# Patient Record
Sex: Male | Born: 1985 | Race: White | Hispanic: No | Marital: Married | Smoking: Never smoker
Health system: Southern US, Community
[De-identification: ages and names within clinical notes are randomized; demographics above are authoritative.]

## PROBLEM LIST (undated history)

## (undated) DIAGNOSIS — K602 Anal fissure, unspecified: Secondary | ICD-10-CM

## (undated) HISTORY — DX: Anal fissure, unspecified: K60.2

## (undated) HISTORY — PX: COLONOSCOPY: SHX174

---

## 2010-05-12 ENCOUNTER — Emergency Department (HOSPITAL_COMMUNITY): Admission: EM | Admit: 2010-05-12 | Discharge: 2010-05-12 | Payer: Self-pay | Admitting: Family Medicine

## 2012-05-13 ENCOUNTER — Ambulatory Visit: Payer: Self-pay | Admitting: Physician Assistant

## 2013-05-03 ENCOUNTER — Encounter: Payer: Self-pay | Admitting: Gastroenterology

## 2013-05-24 ENCOUNTER — Ambulatory Visit (INDEPENDENT_AMBULATORY_CARE_PROVIDER_SITE_OTHER): Payer: No Typology Code available for payment source | Admitting: Gastroenterology

## 2013-05-24 ENCOUNTER — Encounter: Payer: Self-pay | Admitting: Gastroenterology

## 2013-05-24 VITALS — BP 106/68 | HR 76 | Ht 69.75 in | Wt 177.4 lb

## 2013-05-24 DIAGNOSIS — K602 Anal fissure, unspecified: Secondary | ICD-10-CM

## 2013-05-24 MED ORDER — HYDROCORTISONE ACETATE 25 MG RE SUPP: 25.0000 mg | Freq: Two times a day (BID) | RECTAL | Status: DC

## 2013-05-24 MED ORDER — AMBULATORY NON FORMULARY MEDICATION: Status: DC

## 2013-05-24 NOTE — Assessment & Plan Note (Signed)
Plan stool softeners, Anusol a.c. suppository and diltiazem ointment for at least 4 weeks

## 2013-05-24 NOTE — Patient Instructions (Addendum)
Follow up in one month We are sending in your prescriptions to [facility]

## 2013-05-24 NOTE — Progress Notes (Signed)
History of Present Illness: 27 year old white male referred for evaluation of rectal discomfort.  For several months he's had progressive pain when he has a bowel movement.  There's been some bleeding in the toilet water and on the tissue.  He tends to be constipated.  He's tried topicals and stool softeners without relief.  He denies abdominal pain or melena.    History reviewed. No pertinent past medical history. History reviewed. No pertinent past surgical history. family history includes Diabetes in his maternal grandmother; Heart disease in his other. Current Outpatient Prescriptions  Medication Sig Dispense Refill  . docusate sodium (COLACE) 100 MG capsule Take 100 mg by mouth as needed for constipation.      . polyethylene glycol powder (GLYCOLAX/MIRALAX) powder Take 17 g by mouth as needed.       No current facility-administered medications for this visit.   Allergies as of 05/24/2013  . (No Known Allergies)    reports that he has never smoked. He has never used smokeless tobacco. He reports that he does not drink alcohol or use illicit drugs.     Review of Systems: Pertinent positive and negative review of systems were noted in the above HPI section. All other review of systems were otherwise negative.  Vital signs were reviewed in today's medical record Physical Exam: General: Well developed , well nourished, no acute distress Skin: anicteric Head: Normocephalic and atraumatic Eyes:  sclerae anicteric, EOMI Ears: Normal auditory acuity Mouth: No deformity or lesions Neck: Supple, no masses or thyromegaly Lungs: Clear throughout to auscultation Heart: Regular rate and rhythm; no murmurs, rubs or bruits Abdomen: Soft, non tender and non distended. No masses, hepatosplenomegaly or hernias noted. Normal Bowel sounds Rectal: Inspection of the rectum demonstrates an anal fissure at the posterior midline Musculoskeletal: Symmetrical with no gross deformities  Skin: No lesions  on visible extremities Pulses:  Normal pulses noted Extremities: No clubbing, cyanosis, edema or deformities noted Neurological: Alert oriented x 4, grossly nonfocal Cervical Nodes:  No significant cervical adenopathy Inguinal Nodes: No significant inguinal adenopathy Psychological:  Alert and cooperative. Normal mood and affect

## 2013-06-23 ENCOUNTER — Ambulatory Visit (INDEPENDENT_AMBULATORY_CARE_PROVIDER_SITE_OTHER): Payer: No Typology Code available for payment source | Admitting: Gastroenterology

## 2013-06-23 ENCOUNTER — Encounter: Payer: Self-pay | Admitting: Gastroenterology

## 2013-06-23 VITALS — BP 90/60 | HR 60 | Ht 70.0 in | Wt 179.0 lb

## 2013-06-23 DIAGNOSIS — K602 Anal fissure, unspecified: Secondary | ICD-10-CM

## 2013-06-23 NOTE — Progress Notes (Signed)
History of Present Illness:  The patient has returned for followup of his anal fissure.  He continues on diltiazem.  Symptoms are significantly improved.  Pain is decreasing.  He has a tendency to be constipated and takes Colace.    Review of Systems: Pertinent positive and negative review of systems were noted in the above HPI section. All other review of systems were otherwise negative.    Current Medications, Allergies, Past Medical History, Past Surgical History, Family History and Social History were reviewed in [employer] electronic medical record  Vital signs were reviewed in today's medical record. Physical Exam: General: Well developed , well nourished, no acute distress

## 2013-06-23 NOTE — Assessment & Plan Note (Signed)
Symptoms are improving with diltiazem ointment.  Plan to continue for one to 2 months

## 2013-06-23 NOTE — Patient Instructions (Signed)
Follow up as needed

## 2013-08-19 IMAGING — US ABDOMEN ULTRASOUND LIMITED
1 series · 14 of 25 positions shown · non-contrast
Comparison: none

REASON FOR EXAM: RUQ abd pain  eval gallbladder
COMMENTS:

PROCEDURE:     US  - US ABDOMEN LIMITED SURVEY  - May 13, 2012  [DATE]
RESULT:     Gallbladder normal. Gallbladder wall thickness normal at 1.7 mm.
Common bile duct caliber normal at 2.9 mm. Negative Murphy's sign. Pancreas
not visualized.

[Series 1: abdomen ultrasound limited · 0.25mm/px · 14 of 43 slices shown]
[im 1/43]
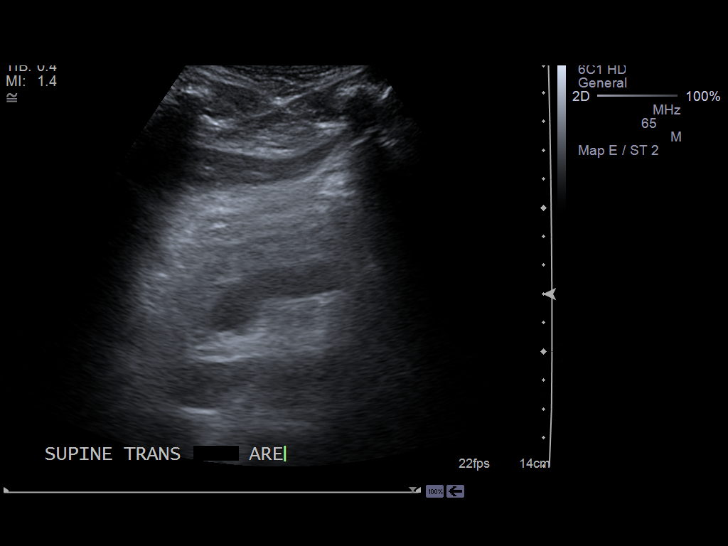
[im 4/43]
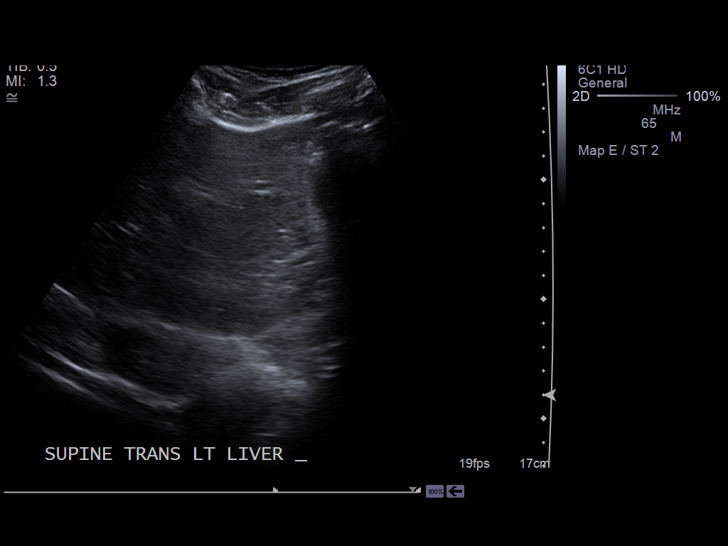
[im 8/43]
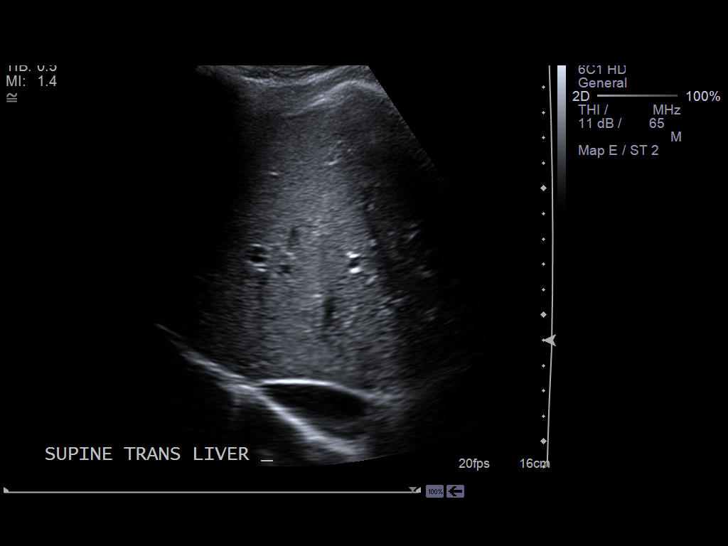
[im 11/43]
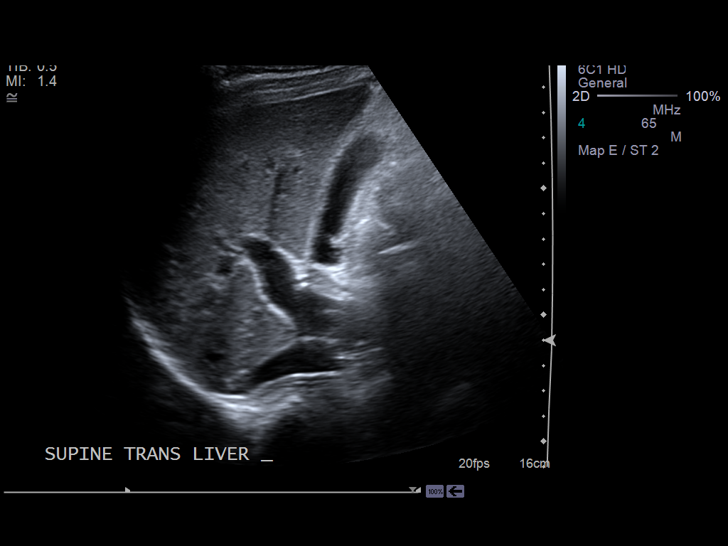
[im 15/43]
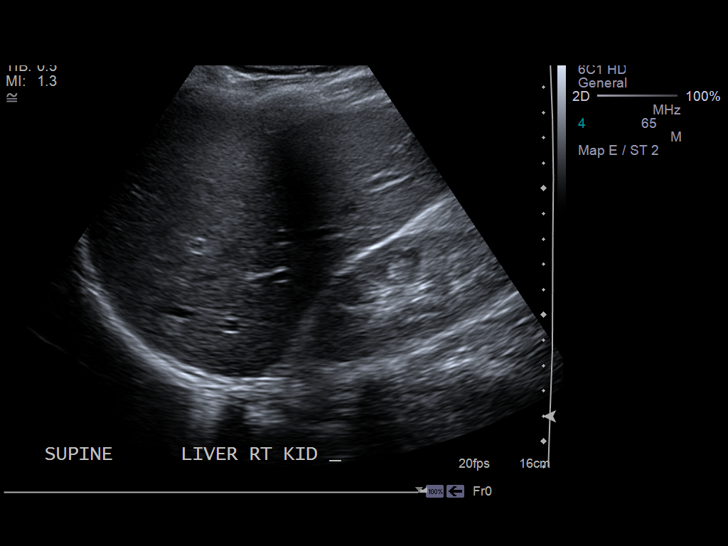
[im 16/43]
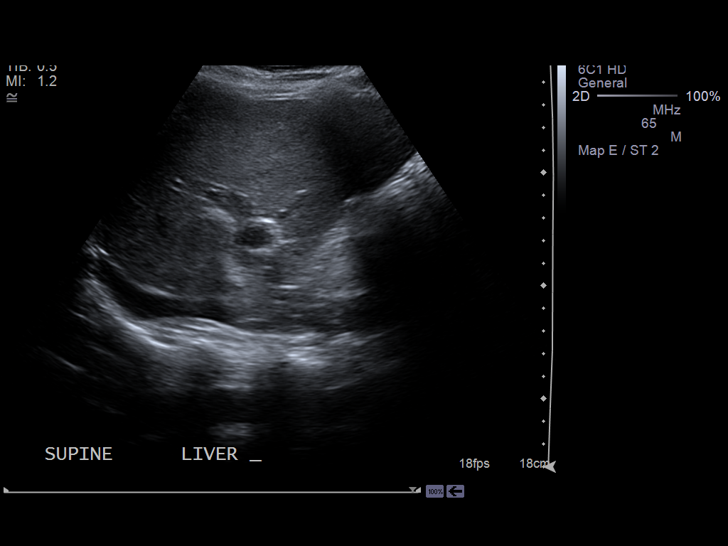
[im 20/43]
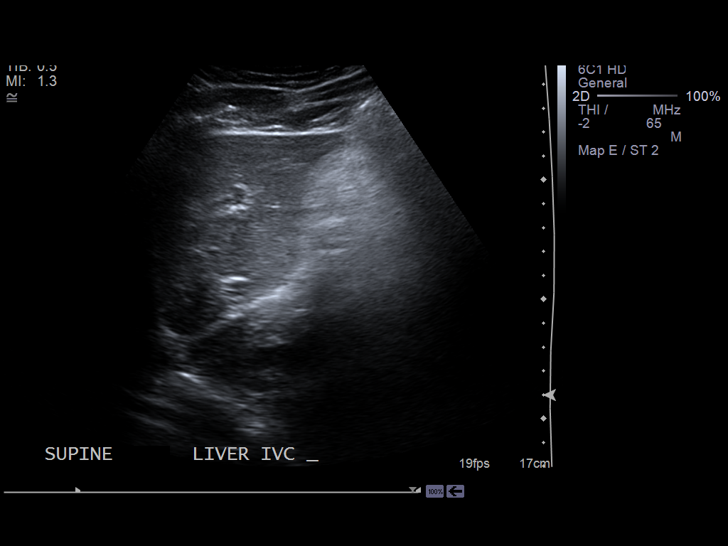
[im 23/43]
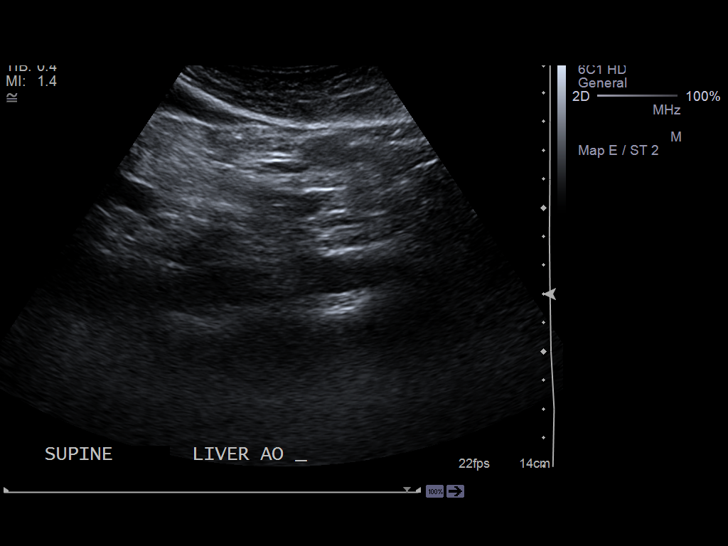
[im 27/43]
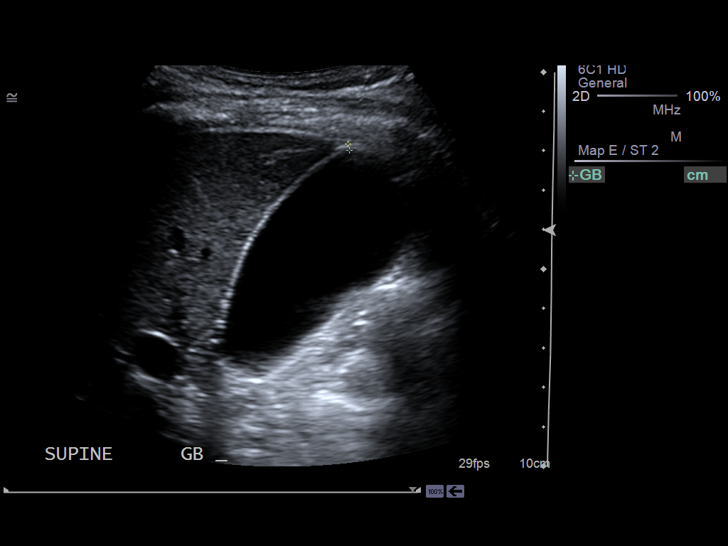
[im 29/43]
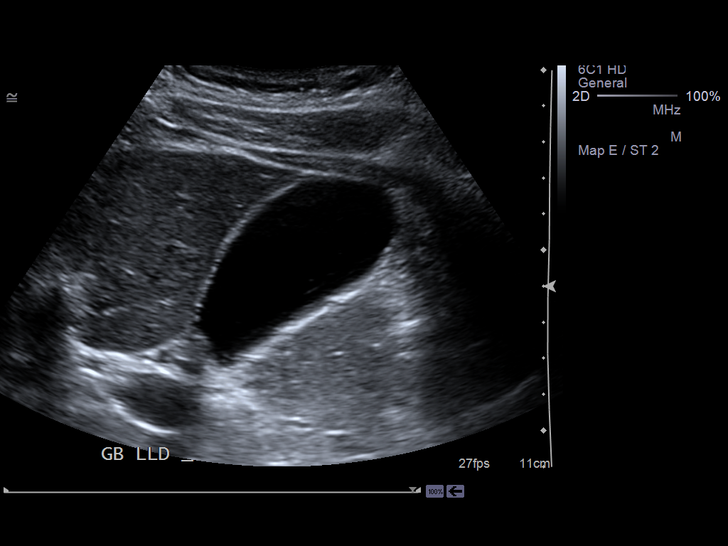
[im 32/43]
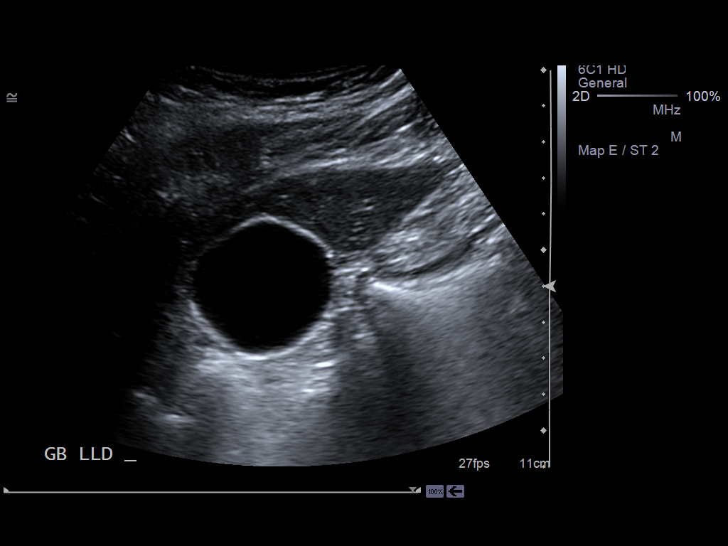
[im 36/43]
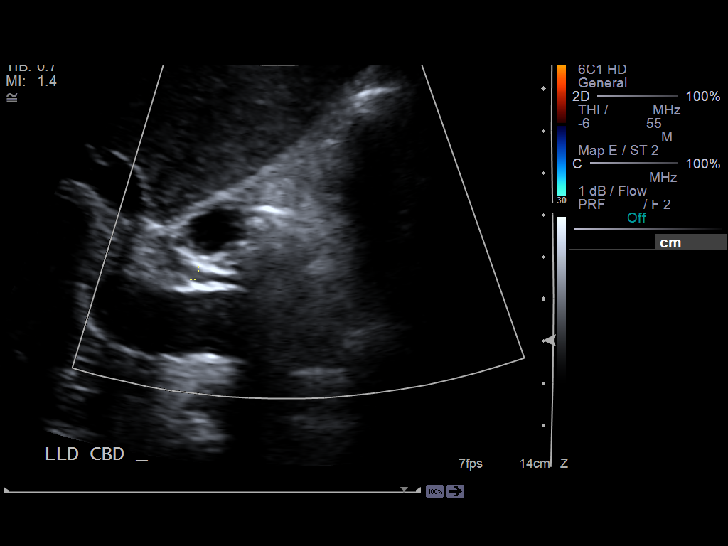
[im 39/43]
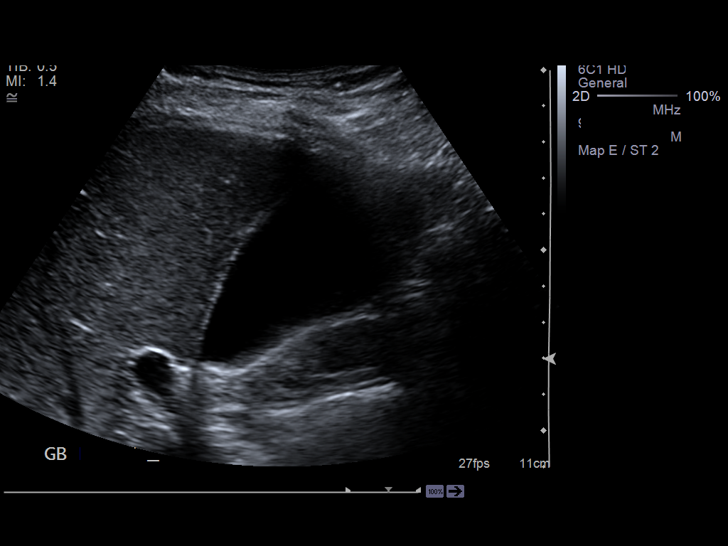
[im 43/43]
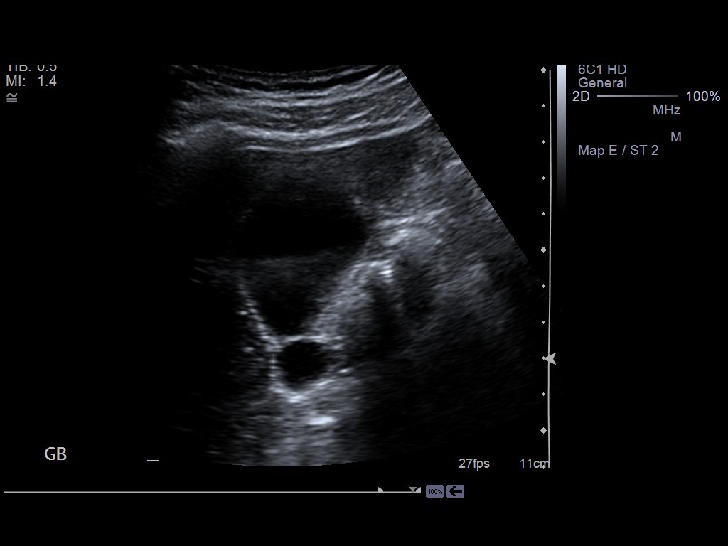

[14 of 25 positions shown; findings below may reference images not displayed]

IMPRESSION: Normal exam.

[REDACTED]

## 2016-12-10 ENCOUNTER — Ambulatory Visit: Payer: No Typology Code available for payment source | Admitting: Physician Assistant

## 2018-06-03 DIAGNOSIS — F40248 Other situational type phobia: Secondary | ICD-10-CM | POA: Insufficient documentation

## 2018-06-03 DIAGNOSIS — F4010 Social phobia, unspecified: Secondary | ICD-10-CM | POA: Insufficient documentation

## 2018-08-20 ENCOUNTER — Other Ambulatory Visit (HOSPITAL_COMMUNITY): Payer: Self-pay | Admitting: Family Medicine

## 2018-08-20 ENCOUNTER — Other Ambulatory Visit: Payer: Self-pay | Admitting: Family Medicine

## 2018-08-20 DIAGNOSIS — R51 Headache: Principal | ICD-10-CM

## 2018-08-20 DIAGNOSIS — R519 Headache, unspecified: Secondary | ICD-10-CM

## 2018-09-09 ENCOUNTER — Ambulatory Visit
Admission: RE | Admit: 2018-09-09 | Discharge: 2018-09-09 | Disposition: A | Discharge status: Home / Self Care | Payer: No Typology Code available for payment source | Source: Ambulatory Visit | Attending: Family Medicine | Admitting: Family Medicine

## 2018-09-09 DIAGNOSIS — R519 Headache, unspecified: Secondary | ICD-10-CM

## 2018-09-09 DIAGNOSIS — R51 Headache: Secondary | ICD-10-CM | POA: Diagnosis present

## 2018-09-09 MED ORDER — GADOBUTROL 1 MMOL/ML IV SOLN
7.5000 mL | Freq: Once | INTRAVENOUS | Status: AC | PRN
  Administered 2018-09-09: 7.5 mL via INTRAVENOUS

## 2019-11-07 DIAGNOSIS — H15831 Staphyloma posticum, right eye: Secondary | ICD-10-CM | POA: Diagnosis not present

## 2020-06-11 DIAGNOSIS — Z0000 Encounter for general adult medical examination without abnormal findings: Secondary | ICD-10-CM | POA: Diagnosis not present

## 2020-06-11 DIAGNOSIS — Z131 Encounter for screening for diabetes mellitus: Secondary | ICD-10-CM | POA: Diagnosis not present

## 2020-06-11 DIAGNOSIS — Z13220 Encounter for screening for lipoid disorders: Secondary | ICD-10-CM | POA: Diagnosis not present

## 2020-11-08 ENCOUNTER — Other Ambulatory Visit (HOSPITAL_COMMUNITY): Payer: Self-pay | Admitting: Ophthalmology

## 2021-04-16 ENCOUNTER — Other Ambulatory Visit: Payer: Self-pay

## 2021-04-19 ENCOUNTER — Other Ambulatory Visit: Payer: Self-pay

## 2021-04-19 MED ORDER — EPINEPHRINE 0.3 MG/0.3ML IJ SOAJ
INTRAMUSCULAR | 0 refills | Status: DC
  Filled 2021-04-19: qty 2, 15d supply, fill #0

## 2021-04-25 ENCOUNTER — Other Ambulatory Visit: Payer: Self-pay

## 2022-05-02 ENCOUNTER — Other Ambulatory Visit: Payer: Self-pay

## 2022-05-02 DIAGNOSIS — E663 Overweight: Secondary | ICD-10-CM | POA: Insufficient documentation

## 2022-05-02 MED ORDER — WEGOVY 0.25 MG/0.5ML ~~LOC~~ SOAJ
SUBCUTANEOUS | 3 refills | Status: DC
  Filled 2022-05-02 – 2022-09-09 (×5): qty 2, 28d supply, fill #0

## 2022-05-07 ENCOUNTER — Other Ambulatory Visit: Payer: Self-pay

## 2022-05-15 ENCOUNTER — Other Ambulatory Visit: Payer: Self-pay

## 2022-06-06 ENCOUNTER — Other Ambulatory Visit: Payer: Self-pay

## 2022-06-19 ENCOUNTER — Other Ambulatory Visit: Payer: Self-pay

## 2022-06-23 ENCOUNTER — Other Ambulatory Visit: Payer: Self-pay

## 2022-09-09 ENCOUNTER — Other Ambulatory Visit: Payer: Self-pay

## 2022-09-12 ENCOUNTER — Other Ambulatory Visit: Payer: Self-pay

## 2022-09-17 ENCOUNTER — Other Ambulatory Visit: Payer: Self-pay

## 2022-09-18 ENCOUNTER — Other Ambulatory Visit: Payer: Self-pay

## 2022-09-19 ENCOUNTER — Other Ambulatory Visit: Payer: Self-pay

## 2022-09-19 MED ORDER — WEGOVY 0.5 MG/0.5ML ~~LOC~~ SOAJ
0.5000 mg | SUBCUTANEOUS | 0 refills | Status: DC
  Filled 2022-09-19 (×2): qty 2, 28d supply, fill #0

## 2022-09-19 MED ORDER — WEGOVY 0.25 MG/0.5ML ~~LOC~~ SOAJ: SUBCUTANEOUS | 0 refills | Status: DC

## 2022-09-19 MED ORDER — WEGOVY 2.4 MG/0.75ML ~~LOC~~ SOAJ
2.4000 mg | SUBCUTANEOUS | 3 refills | Status: DC
  Filled 2022-11-24: qty 3, 28d supply, fill #0
  Filled 2022-12-19: qty 3, 28d supply, fill #1

## 2022-09-19 MED ORDER — WEGOVY 1.7 MG/0.75ML ~~LOC~~ SOAJ
1.7000 mg | SUBCUTANEOUS | 0 refills | Status: DC
  Filled 2022-10-30: qty 3, 28d supply, fill #0

## 2022-09-19 MED ORDER — WEGOVY 1 MG/0.5ML ~~LOC~~ SOAJ
SUBCUTANEOUS | 0 refills | Status: DC
  Filled 2022-10-30: qty 2, 28d supply, fill #0

## 2022-10-02 ENCOUNTER — Other Ambulatory Visit: Payer: Self-pay

## 2022-10-06 ENCOUNTER — Other Ambulatory Visit: Payer: Self-pay

## 2022-10-30 ENCOUNTER — Other Ambulatory Visit: Payer: Self-pay

## 2022-11-03 ENCOUNTER — Other Ambulatory Visit: Payer: Self-pay

## 2022-11-24 ENCOUNTER — Other Ambulatory Visit: Payer: Self-pay

## 2022-12-01 ENCOUNTER — Ambulatory Visit (INDEPENDENT_AMBULATORY_CARE_PROVIDER_SITE_OTHER): Payer: 59

## 2022-12-01 ENCOUNTER — Ambulatory Visit: Payer: 59 | Admitting: Podiatry

## 2022-12-01 ENCOUNTER — Encounter: Payer: Self-pay | Admitting: Podiatry

## 2022-12-01 DIAGNOSIS — M62462 Contracture of muscle, left lower leg: Secondary | ICD-10-CM | POA: Diagnosis not present

## 2022-12-01 DIAGNOSIS — M205X2 Other deformities of toe(s) (acquired), left foot: Secondary | ICD-10-CM

## 2022-12-01 DIAGNOSIS — M7752 Other enthesopathy of left foot: Secondary | ICD-10-CM | POA: Diagnosis not present

## 2022-12-01 DIAGNOSIS — M9262 Juvenile osteochondrosis of tarsus, left ankle: Secondary | ICD-10-CM | POA: Diagnosis not present

## 2022-12-01 DIAGNOSIS — M6528 Calcific tendinitis, other site: Secondary | ICD-10-CM | POA: Diagnosis not present

## 2022-12-01 DIAGNOSIS — M7750 Other enthesopathy of unspecified foot: Secondary | ICD-10-CM

## 2022-12-01 NOTE — Patient Instructions (Signed)
Look for Voltaren gel at the pharmacy over the counter or online (also known as diclofenac 1% gel). Apply to the painful areas 3-4x daily with the supplied dosing card. Allow to dry for 10 minutes before going into socks/shoes   Look for a Morton's extension orthotic on [employer] from [employer] fiber to put under your insole  Achilles Tendinitis  with Rehab Achilles tendinitis is a disorder of the Achilles tendon. The Achilles tendon connects the large calf muscles (Gastrocnemius and Soleus) to the heel bone (calcaneus). This tendon is sometimes called the heel cord. It is important for pushing-off and standing on your toes and is important for walking, running, or jumping. Tendinitis is often caused by overuse and repetitive microtrauma. SYMPTOMS Pain, tenderness, swelling, warmth, and redness may occur over the Achilles tendon even at rest. Pain with pushing off, or flexing or extending the ankle. Pain that is worsened after or during activity. CAUSES  Overuse sometimes seen with rapid increase in exercise programs or in sports requiring running and jumping. Poor physical conditioning (strength and flexibility or endurance). Running sports, especially training running down hills. Inadequate warm-up before practice or play or failure to stretch before participation. Injury to the tendon. PREVENTION  Warm up and stretch before practice or competition. Allow time for adequate rest and recovery between practices and competition. Keep up conditioning. Keep up ankle and leg flexibility. Improve or keep muscle strength and endurance. Improve cardiovascular fitness. Use proper technique. Use proper equipment (shoes, skates). To help prevent recurrence, taping, protective strapping, or an adhesive bandage may be recommended for several weeks after healing is complete. PROGNOSIS  Recovery may take weeks to several months to heal. Longer recovery is expected if symptoms have been prolonged. Recovery  is usually quicker if the inflammation is due to a direct blow as compared with overuse or sudden strain. RELATED COMPLICATIONS  Healing time will be prolonged if the condition is not correctly treated. The injury must be given plenty of time to heal. Symptoms can reoccur if activity is resumed too soon. Untreated, tendinitis may increase the risk of tendon rupture requiring additional time for recovery and possibly surgery. TREATMENT  The first treatment consists of rest anti-inflammatory medication, and ice to relieve the pain. Stretching and strengthening exercises after resolution of pain will likely help reduce the risk of recurrence. Referral to a physical therapist or athletic trainer for further evaluation and treatment may be helpful. A walking boot or cast may be recommended to rest the Achilles tendon. This can help break the cycle of inflammation and microtrauma. Arch supports (orthotics) may be prescribed or recommended by your caregiver as an adjunct to therapy and rest. Surgery to remove the inflamed tendon lining or degenerated tendon tissue is rarely necessary and has shown less than predictable results. MEDICATION  Nonsteroidal anti-inflammatory medications, such as aspirin and ibuprofen, may be used for pain and inflammation relief. Do not take within 7 days before surgery. Take these as directed by your caregiver. Contact your caregiver immediately if any bleeding, stomach upset, or signs of allergic reaction occur. Other minor pain relievers, such as acetaminophen, may also be used. Pain relievers may be prescribed as necessary by your caregiver. Do not take prescription pain medication for longer than 4 to 7 days. Use only as directed and only as much as you need. Cortisone injections are rarely indicated. Cortisone injections may weaken tendons and predispose to rupture. It is better to give the condition more time to heal than to use them.  HEAT AND COLD Cold is used to relieve  pain and reduce inflammation for acute and chronic Achilles tendinitis. Cold should be applied for 10 to 15 minutes every 2 to 3 hours for inflammation and pain and immediately after any activity that aggravates your symptoms. Use ice packs or an ice massage. Heat may be used before performing stretching and strengthening activities prescribed by your caregiver. Use a heat pack or a warm soak. SEEK MEDICAL CARE IF: Symptoms get worse or do not improve in 2 weeks despite treatment. New, unexplained symptoms develop. Drugs used in treatment may produce side effects.  EXERCISES:  RANGE OF MOTION (ROM) AND STRETCHING EXERCISES - Achilles Tendinitis  These exercises may help you when beginning to rehabilitate your injury. Your symptoms may resolve with or without further involvement from your physician, physical therapist or athletic trainer. While completing these exercises, remember:  Restoring tissue flexibility helps normal motion to return to the joints. This allows healthier, less painful movement and activity. An effective stretch should be held for at least 30 seconds. A stretch should never be painful. You should only feel a gentle lengthening or release in the stretched tissue.  STRETCH  Gastroc, Standing  Place hands on wall. Extend right / left leg, keeping the front knee somewhat bent. Slightly point your toes inward on your back foot. Keeping your right / left heel on the floor and your knee straight, shift your weight toward the wall, not allowing your back to arch. You should feel a gentle stretch in the right / left calf. Hold this position for 10 seconds. Repeat 3 times. Complete this stretch 2 times per day.  STRETCH  Soleus, Standing  Place hands on wall. Extend right / left leg, keeping the other knee somewhat bent. Slightly point your toes inward on your back foot. Keep your right / left heel on the floor, bend your back knee, and slightly shift your weight over the back  leg so that you feel a gentle stretch deep in your back calf. Hold this position for 10 seconds. Repeat 3 times. Complete this stretch 2 times per day.  STRETCH  Gastrocsoleus, Standing  Note: This exercise can place a lot of stress on your foot and ankle. Please complete this exercise only if specifically instructed by your caregiver.  Place the ball of your right / left foot on a step, keeping your other foot firmly on the same step. Hold on to the wall or a rail for balance. Slowly lift your other foot, allowing your body weight to press your heel down over the edge of the step. You should feel a stretch in your right / left calf. Hold this position for 10 seconds. Repeat this exercise with a slight bend in your knee. Repeat 3 times. Complete this stretch 2 times per day.   STRENGTHENING EXERCISES - Achilles Tendinitis These exercises may help you when beginning to rehabilitate your injury. They may resolve your symptoms with or without further involvement from your physician, physical therapist or athletic trainer. While completing these exercises, remember:  Muscles can gain both the endurance and the strength needed for everyday activities through controlled exercises. Complete these exercises as instructed by your physician, physical therapist or athletic trainer. Progress the resistance and repetitions only as guided. You may experience muscle soreness or fatigue, but the pain or discomfort you are trying to eliminate should never worsen during these exercises. If this pain does worsen, stop and make certain you are following the directions  exactly. If the pain is still present after adjustments, discontinue the exercise until you can discuss the trouble with your clinician.  STRENGTH - Plantar-flexors  Sit with your right / left leg extended. Holding onto both ends of a rubber exercise band/tubing, loop it around the ball of your foot. Keep a slight tension in the band. Slowly push  your toes away from you, pointing them downward. Hold this position for 10 seconds. Return slowly, controlling the tension in the band/tubing. Repeat 3 times. Complete this exercise 2 times per day.   STRENGTH - Plantar-flexors  Stand with your feet shoulder width apart. Steady yourself with a wall or table using as little support as needed. Keeping your weight evenly spread over the width of your feet, rise up on your toes.* Hold this position for 10 seconds. Repeat 3 times. Complete this exercise 2 times per day.  *If this is too easy, shift your weight toward your right / left leg until you feel challenged. Ultimately, you may be asked to do this exercise with your right / left foot only.  STRENGTH  Plantar-flexors, Eccentric  Note: This exercise can place a lot of stress on your foot and ankle. Please complete this exercise only if specifically instructed by your caregiver.  Place the balls of your feet on a step. With your hands, use only enough support from a wall or rail to keep your balance. Keep your knees straight and rise up on your toes. Slowly shift your weight entirely to your right / left toes and pick up your opposite foot. Gently and with controlled movement, lower your weight through your right / left foot so that your heel drops below the level of the step. You will feel a slight stretch in the back of your calf at the end position. Use the healthy leg to help rise up onto the balls of both feet, then lower weight only on the right / left leg again. Build up to 15 repetitions. Then progress to 3 consecutive sets of 15 repetitions.* After completing the above exercise, complete the same exercise with a slight knee bend (about 30 degrees). Again, build up to 15 repetitions. Then progress to 3 consecutive sets of 15 repetitions.* Perform this exercise 2 times per day.  *When you easily complete 3 sets of 15, your physician, physical therapist or athletic trainer may advise you to  add resistance by wearing a backpack filled with additional weight.  STRENGTH - Plantar Flexors, Seated  Sit on a chair that allows your feet to rest flat on the ground. If necessary, sit at the edge of the chair. Keeping your toes firmly on the ground, lift your right / left heel as far as you can without increasing any discomfort in your ankle. Repeat 3 times. Complete this exercise 2 times a day.

## 2022-12-02 ENCOUNTER — Encounter: Payer: Self-pay | Admitting: Podiatry

## 2022-12-02 MED ORDER — MELOXICAM 15 MG PO TABS: 15.0000 mg | ORAL_TABLET | Freq: Every day | ORAL | 3 refills | Status: DC

## 2022-12-02 NOTE — Progress Notes (Signed)
  Subjective:  Patient ID: Andrew Harvey, male    DOB: December 21, 1985,  MRN: [id number]  Chief Complaint  Patient presents with   Foot Pain    (np) L chronic foot pain    37 y.o. male presents with the above complaint. History confirmed with patient.  Pain is in the back of both heels is worse on the left side, his Achilles tendon feels very tight when he gets up first thing in the morning gets out of bed.  Also notes some pain around the big toe joint on the left  Objective:  Physical Exam: warm, good capillary refill, no trophic changes or ulcerative lesions, normal DP and PT pulses, and normal sensory exam. Left Foot: tenderness at Achilles tendon insertion, gastrocnemius equinus is noted with a positive silverskiold test, and pain and tenderness over dorsal first MTPJ especially with plantarflexion along the EHL tendon, palpable dorsal spur Right Foot: gastrocnemius equinus is noted with a positive silverskiold test  No images are attached to the encounter.  Radiographs: Multiple views x-ray of the left foot: no fracture, dislocation, swelling or degenerative changes noted, posterior calcaneal spur, Haglund deformity noted, and dorsal spur of first MTPJ, good joint space noted Assessment:   1. 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 Gastrocnemius equinus of left lower extremity   3. Hallux limitus, left   4. Capsulitis of metatarsophalangeal (MTP) joint of left foot      Plan:  Patient was evaluated and treated and all questions answered.   Discussed the etiology and treatment options for Achilles tendinitis including stretching, formal physical therapy with an eccentric exercises therapy plan, supportive shoegears such as a running shoe or sneaker, heel lifts, topical and oral medications.  We also discussed that I do not routinely perform injections in this area because of the risk of an increased damage or rupture of the tendon.  We also discussed the role of  surgical treatment of this for patients who do not improve after exhausting non-surgical treatment options.  -XR reviewed with patient -Educated on stretching and icing of the affected limb. -Rx for Meloxicam. Advised on risks, benefits, and alternatives of the medication -I recommended Voltaren gel to use 3-4 times daily. -Heel lifts were dispensed to offload the tendon and reduce pressure from heel counter of shoe -Discussed that if not better in 1 month would recommend formal physical therapy, consider immobilization if worsening  Regarding his hallux limitus this seems to be a grade 1 issue and most of the pain is on the tendon rubbing on the dorsal spur.  We discussed utilizing a carbon fiber Morton's extension insole.  He will let me know if this worsens or requires corticosteroid injection.  No follow-ups on file.

## 2022-12-19 ENCOUNTER — Other Ambulatory Visit: Payer: Self-pay

## 2023-01-21 ENCOUNTER — Other Ambulatory Visit: Payer: Self-pay

## 2023-02-16 ENCOUNTER — Encounter: Payer: Self-pay | Admitting: Gastroenterology

## 2023-02-27 ENCOUNTER — Ambulatory Visit: Payer: 59 | Admitting: Gastroenterology

## 2023-02-27 ENCOUNTER — Encounter: Payer: Self-pay | Admitting: Gastroenterology

## 2023-02-27 VITALS — BP 122/80 | HR 90 | Ht 70.0 in | Wt 187.0 lb

## 2023-02-27 DIAGNOSIS — K640 First degree hemorrhoids: Secondary | ICD-10-CM

## 2023-02-27 DIAGNOSIS — R194 Change in bowel habit: Secondary | ICD-10-CM | POA: Diagnosis not present

## 2023-02-27 DIAGNOSIS — K649 Unspecified hemorrhoids: Secondary | ICD-10-CM

## 2023-02-27 MED ORDER — NA SULFATE-K SULFATE-MG SULF 17.5-3.13-1.6 GM/177ML PO SOLN: 1.0000 | Freq: Once | ORAL | 0 refills | Status: AC

## 2023-02-27 NOTE — Progress Notes (Signed)
HPI : Andrew Harvey is a 37 y.o. male who is referred to [place] by Marisue Ivan, MD for further evaluation of perianal discomfort and change in stool caliber.  He states that he has had narrow caliber stools for 6 months or so.  He does not think he has had a normal caliber or stool in quite some time.  His bowel frequency has not changed, typically 1 stool per day.  He denies significant straining with bowel movements.  He symptoms has issues with constipation and will take a stool softener as needed.  No blood in the stool.  He denies any itching or burning.  He does report perianal pressure and "swelling".  He denies any pain with the passage of stool. He has a history of an anal fissure treated conservatively in 2014.  He also states he has had multiple episodes of thrombosed external hemorrhoids which have required excision and drainage. No problems with diarrhea, urgency or incontinence. His weight has been stable. He has no family history of colon cancer.    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    Past Medical History:  Diagnosis Date   Anal fissure      No past surgical history on file. Family History  Problem Relation Age of Onset   Diabetes Maternal Grandmother    Heart disease Other        fathers side ?   Social History   Tobacco Use   Smoking status: Never   Smokeless tobacco: Never  Substance Use Topics   Alcohol use: No   Drug use: No   Current Outpatient Medications  Medication Sig Dispense Refill   AMBULATORY NON FORMULARY MEDICATION Medication Name: Diltizem gel 2%  Use bid 1 Tube 0   docusate sodium (COLACE) 100 MG capsule Take 100 mg by mouth as needed for constipation.     EPINEPHrine 0.3 mg/0.3 mL IJ SOAJ injection use as directed for anaphylaxis 2 each 0   meloxicam (MOBIC) 15 MG tablet Take 1 tablet (15 mg total) by mouth daily. 30 tablet 3   polyethylene glycol powder (GLYCOLAX/MIRALAX)  powder Take 17 g by mouth as needed.     Semaglutide-Weight Management (WEGOVY) 0.25 MG/0.5ML SOAJ Inject 0.5 mLs (0.25 mg total) subcutaneously once a week 2 mL 3   Semaglutide-Weight Management (WEGOVY) 0.25 MG/0.5ML SOAJ Inject 0.5 mLs (0.25 mg total) subcutaneously once a week 2 mL 0   Semaglutide-Weight Management (WEGOVY) 0.5 MG/0.5ML SOAJ Inject 0.5 mg into the skin once a week. 2 mL 0   Semaglutide-Weight Management (WEGOVY) 1 MG/0.5ML SOAJ Inject 0.5 mLs (1 mg total) subcutaneously once a week 2 mL 0   Semaglutide-Weight Management (WEGOVY) 1.7 MG/0.75ML SOAJ Inject 1.7 mg into the skin once a week. 3 mL 0   Semaglutide-Weight Management (WEGOVY) 2.4 MG/0.75ML SOAJ Take 2.4 mg by mouth once a week. 3 mL 3   No current facility-administered medications for this visit.   No Known Allergies   Review of Systems: All systems reviewed and negative except where noted in HPI.    No results found.  Physical Exam: BP 122/80   Pulse 90   Ht 5\' 10"  (1.778 m)   Wt 187 lb (84.8 kg)   BMI 26.83 kg/m  Constitutional: Pleasant,well-developed, Caucasian male in no acute distress. HEENT: Normocephalic and atraumatic. Conjunctivae are normal. No scleral icterus. Neck supple.  Cardiovascular: Normal rate, regular rhythm.  Pulmonary/chest: Effort normal and  breath sounds normal. No wheezing, rales or rhonchi. Abdominal: Soft, nondistended, nontender. Bowel sounds active throughout. There are no masses palpable. No hepatomegaly. Extremities: no edema Rectal: Chaperone declined by patient, No external hemorrhoids or skin tags.  No anal fissure present.  Digital rectal exam notable for elongated normal sphincter tone, no mass lesions.  Anoscopy was performed and showed multiple enlarged hemorrhoid columns, with the right posterior column being the most prominent. Neurological: Alert and oriented to person place and time. Skin: Skin is warm and dry. No rashes noted. Psychiatric: Normal mood and  affect. Behavior is normal.  CBC No results found for: "WBC", "RBC", "HGB", "HCT", "PLT", "MCV", "MCH", "MCHC", "RDW", "LYMPHSABS", "MONOABS", "EOSABS", "BASOSABS"  CMP  No results found for: "NA", "K", "CL", "CO2", "GLUCOSE", "BUN", "CREATININE", "CALCIUM", "PROT", "ALBUMIN", "AST", "ALT", "ALKPHOS", "BILITOT", "GFRNONAA", "GFRAA"      No data to display            ASSESSMENT AND PLAN: 37 year old male history of anal fissure and thrombosed external hemorrhoids with recent change in stool caliber and persistent rectal pressure and fullness.  No symptoms of rectal bleeding or pain with defecation.  I suspect his rectal discomfort is secondary to enlarged internal hemorrhoids. We discussed the anatomy and physiology of hemorrhoids, as well as the principles of hemorrhoid management to include optimization of bowel habits, with a goal of passing a soft formed stool daily without straining, avoidance of hard stools and straining, limiting time on the toilet to less than 5 minutes.  We discussed the role of fiber in optimizing the stool bulk and consistency.  We also discussed the role of hemorrhoid banding for persistent symptoms. The patient was interested in pursuing hemorrhoid banding.  We will schedule him for hemorrhoid banding following his colonoscopy. 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  Although I think the changes in his stool caliber are likely not secondary to a mass lesion, I think a colonoscopy is reasonable to exclude this possibility and also exclude any sort of rectal pathology as the source of his rectal pressure/discomfort.  Change in bowel habits - Colonoscopy -Daily fiber  Internal hemorrhoids - Banding -Daily fiber  The details, risks (including bleeding, perforation, infection, missed lesions, medication reactions and possible hospitalization or surgery if complications occur), benefits, and  alternatives to colonoscopy with possible biopsy and possible polypectomy were discussed with the patient and he consents to proceed.   Kourtlynn Trevor E. Tomasa Rand, MD [facility]   CC:  Marisue Ivan, MD

## 2023-02-27 NOTE — Patient Instructions (Signed)
_______________________________________________________  If your blood pressure at your visit was 140/90 or greater, please contact your primary care physician to follow up on this.  _______________________________________________________  If you are age 39 or older, your body mass index should be between 23-30. Your Body mass index is 26.83 kg/m. If this is out of the aforementioned range listed, please consider follow up with your Primary Care Provider.  If you are age 81 or younger, your body mass index should be between 19-25. Your Body mass index is 26.83 kg/m. If this is out of the aformentioned range listed, please consider follow up with your Primary Care Provider.   You have been scheduled for a colonoscopy. Please follow written instructions given to you at your visit today.  Please pick up your prep supplies at the pharmacy within the next 1-3 days. If you use inhalers (even only as needed), please bring them with you on the day of your procedure.   ________________________________________________________  The [facility] providers would like to encourage you to use [facility] to communicate with providers for non-urgent requests or questions.  Due to long hold times on the telephone, sending your provider a message by [facility] may be a faster and more efficient way to get a response.  Please allow 48 business hours for a response.  Please remember that this is for non-urgent requests.   It was a pleasure to see you today!  Thank you for trusting me with your gastrointestinal care!    Scott E.Tomasa Rand, MD

## 2023-03-17 ENCOUNTER — Other Ambulatory Visit: Payer: Self-pay | Admitting: Oncology

## 2023-03-17 DIAGNOSIS — Z006 Encounter for examination for normal comparison and control in clinical research program: Secondary | ICD-10-CM

## 2023-03-31 DIAGNOSIS — H5213 Myopia, bilateral: Secondary | ICD-10-CM | POA: Diagnosis not present

## 2023-04-10 ENCOUNTER — Encounter: Payer: 59 | Admitting: Gastroenterology

## 2023-04-23 ENCOUNTER — Encounter: Payer: 59 | Admitting: Gastroenterology

## 2023-06-09 DIAGNOSIS — Z131 Encounter for screening for diabetes mellitus: Secondary | ICD-10-CM | POA: Diagnosis not present

## 2023-06-09 DIAGNOSIS — Z136 Encounter for screening for cardiovascular disorders: Secondary | ICD-10-CM | POA: Diagnosis not present

## 2023-06-09 DIAGNOSIS — Z0000 Encounter for general adult medical examination without abnormal findings: Secondary | ICD-10-CM | POA: Diagnosis not present

## 2023-06-16 DIAGNOSIS — Z0000 Encounter for general adult medical examination without abnormal findings: Secondary | ICD-10-CM | POA: Diagnosis not present

## 2023-07-06 ENCOUNTER — Other Ambulatory Visit: Payer: Self-pay

## 2023-07-06 DIAGNOSIS — R1314 Dysphagia, pharyngoesophageal phase: Secondary | ICD-10-CM | POA: Diagnosis not present

## 2023-07-06 DIAGNOSIS — K219 Gastro-esophageal reflux disease without esophagitis: Secondary | ICD-10-CM | POA: Diagnosis not present

## 2023-07-06 MED ORDER — OMEPRAZOLE 40 MG PO CPDR
40.0000 mg | DELAYED_RELEASE_CAPSULE | Freq: Two times a day (BID) | ORAL | 4 refills | Status: AC
  Filled 2023-07-06: qty 30, 15d supply, fill #0

## 2023-07-07 ENCOUNTER — Other Ambulatory Visit: Payer: Self-pay

## 2023-10-28 ENCOUNTER — Other Ambulatory Visit: Payer: Self-pay

## 2023-10-28 MED ORDER — ATOVAQUONE-PROGUANIL HCL 250-100 MG PO TABS
1.0000 | ORAL_TABLET | Freq: Every day | ORAL | 0 refills | Status: DC
  Filled 2023-10-28: qty 16, 16d supply, fill #0

## 2023-10-28 MED ORDER — AZITHROMYCIN 500 MG PO TABS
ORAL_TABLET | ORAL | 0 refills | Status: DC
  Filled 2023-10-28: qty 4, 3d supply, fill #0

## 2023-10-29 ENCOUNTER — Other Ambulatory Visit: Payer: Self-pay

## 2023-11-04 ENCOUNTER — Other Ambulatory Visit: Payer: Self-pay

## 2023-11-04 MED ORDER — SCOPOLAMINE 1 MG/3DAYS TD PT72
1.0000 | MEDICATED_PATCH | TRANSDERMAL | 0 refills | Status: DC
  Filled 2023-11-04: qty 5, 15d supply, fill #0

## 2023-11-04 MED ORDER — CIPROFLOXACIN HCL 500 MG PO TABS
500.0000 mg | ORAL_TABLET | Freq: Two times a day (BID) | ORAL | 0 refills | Status: DC
  Filled 2023-11-04: qty 20, 10d supply, fill #0

## 2023-11-05 ENCOUNTER — Other Ambulatory Visit: Payer: Self-pay

## 2024-02-17 ENCOUNTER — Other Ambulatory Visit: Payer: Self-pay

## 2024-02-17 MED ORDER — SCOPOLAMINE 1 MG/3DAYS TD PT72
1.0000 | MEDICATED_PATCH | TRANSDERMAL | 0 refills | Status: DC
  Filled 2024-02-17: qty 8, 24d supply, fill #0

## 2024-03-02 ENCOUNTER — Other Ambulatory Visit: Payer: Self-pay

## 2024-03-03 ENCOUNTER — Other Ambulatory Visit: Payer: Self-pay

## 2024-03-03 MED ORDER — AZITHROMYCIN 250 MG PO TABS
ORAL_TABLET | ORAL | 0 refills | Status: AC
  Filled 2024-03-03: qty 6, 5d supply, fill #0

## 2024-03-03 MED ORDER — OSELTAMIVIR PHOSPHATE 75 MG PO CAPS
75.0000 mg | ORAL_CAPSULE | ORAL | 0 refills | Status: DC
  Filled 2024-03-03: qty 10, 10d supply, fill #0

## 2024-03-03 MED ORDER — ONDANSETRON HCL 8 MG PO TABS
8.0000 mg | ORAL_TABLET | Freq: Three times a day (TID) | ORAL | 0 refills | Status: DC | PRN
  Filled 2024-03-03: qty 30, 10d supply, fill #0

## 2024-03-15 ENCOUNTER — Other Ambulatory Visit: Payer: Self-pay

## 2024-06-09 DIAGNOSIS — Z0000 Encounter for general adult medical examination without abnormal findings: Secondary | ICD-10-CM | POA: Diagnosis not present

## 2024-06-10 ENCOUNTER — Other Ambulatory Visit: Payer: Self-pay | Admitting: Medical Genetics

## 2024-06-10 DIAGNOSIS — Z006 Encounter for examination for normal comparison and control in clinical research program: Secondary | ICD-10-CM

## 2024-06-16 DIAGNOSIS — Z136 Encounter for screening for cardiovascular disorders: Secondary | ICD-10-CM | POA: Diagnosis not present

## 2024-06-16 DIAGNOSIS — Z0000 Encounter for general adult medical examination without abnormal findings: Secondary | ICD-10-CM | POA: Diagnosis not present

## 2024-06-16 DIAGNOSIS — Z1331 Encounter for screening for depression: Secondary | ICD-10-CM | POA: Diagnosis not present

## 2024-06-16 DIAGNOSIS — R1310 Dysphagia, unspecified: Secondary | ICD-10-CM | POA: Diagnosis not present

## 2024-06-16 DIAGNOSIS — Z131 Encounter for screening for diabetes mellitus: Secondary | ICD-10-CM | POA: Diagnosis not present

## 2024-06-27 ENCOUNTER — Other Ambulatory Visit: Payer: Self-pay

## 2024-06-27 DIAGNOSIS — Z0000 Encounter for general adult medical examination without abnormal findings: Secondary | ICD-10-CM

## 2024-06-28 ENCOUNTER — Ambulatory Visit
Admission: RE | Admit: 2024-06-28 | Discharge: 2024-06-28 | Disposition: A | Discharge status: Home / Self Care | Source: Ambulatory Visit

## 2024-06-28 DIAGNOSIS — K224 Dyskinesia of esophagus: Secondary | ICD-10-CM | POA: Diagnosis not present

## 2024-06-28 DIAGNOSIS — K219 Gastro-esophageal reflux disease without esophagitis: Secondary | ICD-10-CM | POA: Diagnosis not present

## 2024-06-28 DIAGNOSIS — Z0000 Encounter for general adult medical examination without abnormal findings: Secondary | ICD-10-CM | POA: Diagnosis not present

## 2024-06-28 DIAGNOSIS — R059 Cough, unspecified: Secondary | ICD-10-CM | POA: Diagnosis not present

## 2024-06-29 ENCOUNTER — Other Ambulatory Visit: Payer: Self-pay | Admitting: Family Medicine

## 2024-06-29 DIAGNOSIS — R1310 Dysphagia, unspecified: Secondary | ICD-10-CM

## 2024-07-01 ENCOUNTER — Ambulatory Visit
Admission: RE | Admit: 2024-07-01 | Discharge: 2024-07-01 | Disposition: A | Discharge status: Home / Self Care | Source: Ambulatory Visit | Attending: Family Medicine | Admitting: Family Medicine

## 2024-07-01 DIAGNOSIS — R1310 Dysphagia, unspecified: Secondary | ICD-10-CM | POA: Diagnosis not present

## 2024-07-01 NOTE — Procedures (Signed)
 Modified Barium Swallow Study  Patient Details  Name: SEVILLE DOWNS MRN: [id number] Date of Birth: [birth year]  Today's Date: 07/01/2024  Modified Barium Swallow completed.  Full report located under Chart Review in the Imaging Section.  History of Present Illness Pt is a 38 year old male who was referred for a Modified Barium Swallow Study by his PCP d/t report pt report of intermittently choking on own salvia, dysphagia with solids. He has history of GERD, currently taking omeprazole  and Pepcid.       DG Esophagus 06/28/2024   Swallowing: Vestibular penetration was sensed aspiration of initial swallow of thin barium. Patient reports he took to big of a mouthful. Subsequent swallow without aspiration or penetration.   Esophageal motility: Mild esophageal dysmotility with intermittent  proximal escape and delayed passage of contrast from the esophagus  into the stomach.   Clinical Impression Pt reports some break thru symptoms of reflux that is characterized by intermittent burning, gurgling, globus sensation that is cleared when alternating liquids with solids. He further reports occasionally aspirating salvia while sitting at his desk, throat clearing after eating and sensation of mucus on the back of his throat. No dysphonia observed.   During this study, pt presented with adequate oropharyngeal abilities when consuming thin liquids, nectar thick liquids, puree, graham crackers with barium paste and whole barium tablet with thin liquids. Pt's oral phase was complete with no residue observed. His pharyngeal phase was swift (initiation at the ramus) and strong with complete pharyngeal stripping observed. When consuming larger consecutive sips, pt was free of penetration/aspiration. Pt was observed with subtle throat clearing throughout study but no evidence of barium was observed. Following the study, pt commented that he still felt a globus sensation in the back of his throat. The results of  this study (no s/s of oropharyngeal dysphagia were observed), video reviewed and reflux precautions (eating smaller more frequent meals, avoid eating at night, increasing HOB) were shared with pt. Recommend continued follow up with PCP and/or GI.    Factors that may increase risk of adverse event in presence of aspiration Noe & Lianne 2021):  (potential uncontrolled reflux)  Swallow Evaluation Recommendations Recommendations: PO diet PO Diet Recommendation: Regular;Thin liquids (Level 0) Liquid Administration via: Cup;Straw Medication Administration: Whole meds with liquid Supervision: Patient able to self-feed Swallowing strategies  : Minimize environmental distractions;Slow rate;Small bites/sips Postural changes: Position pt fully upright for meals;Stay upright 30-60 min after meals Oral care recommendations: Oral care BID (2x/day) Recommended consults: Consider GI consultation    Jaylnn Ullery B. Rubbie, M.S., CCC-SLP, Tree Surgeon Certified Brain Injury Specialist [facility]  [facility] Office [phone] Ascom [phone] Fax [phone]

## 2024-08-02 ENCOUNTER — Ambulatory Visit: Admitting: Gastroenterology

## 2024-08-02 ENCOUNTER — Other Ambulatory Visit: Payer: Self-pay

## 2024-08-02 ENCOUNTER — Encounter: Payer: Self-pay | Admitting: Gastroenterology

## 2024-08-02 VITALS — BP 120/72 | HR 88 | Ht 70.0 in | Wt 186.0 lb

## 2024-08-02 DIAGNOSIS — R1319 Other dysphagia: Secondary | ICD-10-CM | POA: Diagnosis not present

## 2024-08-02 DIAGNOSIS — K219 Gastro-esophageal reflux disease without esophagitis: Secondary | ICD-10-CM | POA: Diagnosis not present

## 2024-08-02 DIAGNOSIS — K642 Third degree hemorrhoids: Secondary | ICD-10-CM

## 2024-08-02 DIAGNOSIS — K921 Melena: Secondary | ICD-10-CM | POA: Diagnosis not present

## 2024-08-02 DIAGNOSIS — K5900 Constipation, unspecified: Secondary | ICD-10-CM

## 2024-08-02 MED ORDER — NA SULFATE-K SULFATE-MG SULF 17.5-3.13-1.6 GM/177ML PO SOLN
1.0000 | Freq: Once | ORAL | 0 refills | Status: AC
  Filled 2024-08-02: qty 354, 1d supply, fill #0

## 2024-08-02 NOTE — Patient Instructions (Signed)
 You have been scheduled for a colonoscopy. Please follow written instructions given to you at your visit today.   If you use inhalers (even only as needed), please bring them with you on the day of your procedure.  DO NOT TAKE 7 DAYS PRIOR TO TEST- Trulicity (dulaglutide) Ozempic , Wegovy  (semaglutide ) Mounjaro, Zepbound (tirzepatide) Bydureon Bcise (exanatide extended release)  DO NOT TAKE 1 DAY PRIOR TO YOUR TEST Rybelsus  (semaglutide ) Adlyxin (lixisenatide) Victoza (liraglutide) Byetta (exanatide) _____________________________________________________________  Continue omeprazole  daily.   GERD in Adults: Diet Changes When you have gastroesophageal reflux disease (GERD), you may need to make changes to your diet. Choosing the right foods can help with your symptoms. Think about working with an expert in healthy eating called a dietitian. They can help you make healthy food choices. What are tips for following this plan? Reading food labels Look for foods that are low in saturated fat. Foods that may help with your symptoms include: Foods with less than 5% of daily value (DV) of fat. Foods with 0 grams of trans fat. Cooking [employer] in ways that don't use a lot of fat. These ways include: Baking. Steaming. Grilling. Broiling. To add flavor, try to use herbs that are low in spice and acidity. Avoid frying your food. Meal planning  Eat small meals often rather than eating 3 large meals each day. Eat your meals slowly in a place where you feel relaxed. If told by your health care provider, avoid: Foods that cause symptoms. Keep a food diary to keep track of foods that cause symptoms. Alcohol. Drinking a lot of liquid with meals. General instructions For 2-3 hours after you eat, avoid: Bending over. Exercise. Lying down. Chew sugar-free gum after meals. What foods should I eat? Eat a healthy diet. Try to include: Foods with high amounts of fiber. These  include: Fruits and vegetables. Whole grains and beans. Low-fat dairy products. Lean meats, fish, and poultry. Egg whites. Foods that cause symptoms in someone else may not cause symptoms for you. Work with your provider to find foods that are safe for you. The items listed above may not be all the foods and drinks you can have. Talk with a dietitian to learn more. The items listed above may not be a complete list of foods and beverages you can eat and drink. Contact a dietitian for more information. What foods should I avoid? Limiting some of these foods may help with your symptoms. Each person is different. Talk with a dietitian or your provider to help you find the exact foods to avoid. Some of the foods to avoid may include: Fruits Fruits with a lot of acid in them. These may include citrus fruits, such as oranges, grapefruit, pineapple, and lemons. Vegetables Deep-fried vegetables, such as French fries. Vegetables, sauces, or toppings made with added fat and vegetables with acid in them. These may include tomatoes and tomato products, chili peppers, onions, garlic, and horseradish. Grains Pastries or quick breads with added fat. Meats and other proteins High-fat meats, such as fatty beef or pork, hot dogs, ribs, ham, sausage, salami, and bacon. Fried meat or protein, such as fried fish and fried chicken. Egg yolks. Fats and oils Butter. Margarine. Shortening. Ghee. Drinks Coffee and other drinks with caffeine in them. Fizzy and sugary drinks, such as soda and energy drinks. Fruit juice made with acidic fruits, such as orange or grapefruit. Tomato juice. Sweets and desserts Chocolate and cocoa. Donuts. Seasonings and condiments Mint, such as peppermint and spearmint.  Condiments, herbs, or seasonings that cause symptoms. These may include curry, hot sauce, or vinegar-based salad dressings. The items listed above may not be all the foods and drinks you should avoid. Talk with a  dietitian to learn more. Questions to ask your health care provider Changes to your diet and everyday life are often the first steps taken to manage symptoms of GERD. If these changes don't help, talk with your provider about taking medicines. Where to find more information International Foundation for Gastrointestinal Disorders: aboutgerd.org This information is not intended to replace advice given to you by your health care provider. Make sure you discuss any questions you have with your health care provider. Document Revised: 06/30/2023 Document Reviewed: 01/14/2023 Elsevier Patient Education  2024 [employer].

## 2024-08-02 NOTE — Progress Notes (Signed)
 Discussed the use of AI scribe software for clinical note transcription with the patient, who gave verbal consent to proceed.  HPI : Andrew Harvey is a 38 year old male who presents for follow-up of symptomatic hemorrhoids and GERD symptoms  I initially saw him in June 2024 with changes in stool caliber and perianal discomfort.  An anoscopic exam demonstrated presence of prominent hemorrhoidal columns, and he was recommended to undergo a colonoscopy followed by hemorrhoid banding.  He elected not to proceed with the colonoscopy at that time.  He continues to experience pressure and occasional bleeding, as well as chronic constipation. He takes Miralax daily, which helps to some extent, but he still experiences symptoms such as prolapse requiring manual reduction and pressure after bowel movements.  As long as he is taking the MiraLAX daily he will usually have a formed stool most days, and denies problems with hard stools or straining with defecation.  He has a longstanding history of chronic GI symptoms such as abdominal pain and diarrhea going back to childhood.  More recently, he is not had as much issues with abdominal pain and diarrhea.  He has a history of thrombosed external hemorrhoids, which have been excised in the past.  He has a history of swallowing difficulties and was evaluated by ENT and speech pathology. An esophagram in October 2025 was performed to evaluate swallowing difficulties and showed mild dysmotility, but was otherwise normal.  There is no evidence of mass or stricture.  The patient states that he felt significantly relieved from these findings, and admits that this may be in part responsible for the improvement in his symptoms.. He has been taking omeprazole  for GERD, which has improved his symptoms, although he still experiences occasional issues with reflux and swallowing. He takes omeprazole  daily, sometimes twice a day, and uses Pepcid as needed for acute  symptoms.   He has no known family history of colorectal cancer, as he is unaware of his maternal family history. He tends to skip breakfast and lunch, often eating a large dinner, which he suspects may contribute to his symptoms. No unintentional weight loss.      SLP evaluation in October this year did not reveal any evidence of oropharyngeal dysphagia.  He was seen by ENT in November of last year for pharyngeal dysphagia (encounter not available for review) but reportedly nasopharyngoscopy was unremarkable.  DG Esophagus 06/28/2024   Swallowing: Vestibular penetration was sensed aspiration of initial swallow of thin barium. Patient reports he took to big of a mouthful. Subsequent swallow without aspiration or penetration.   Esophageal motility: Mild esophageal dysmotility with intermittent  proximal escape and delayed passage of contrast from the esophagus  into the stomach  Past Medical History:  Diagnosis Date   Anal fissure      No past surgical history on file. Family History  Problem Relation Age of Onset   Diabetes Maternal Grandmother    Heart disease Other        fathers side ?   Liver disease Neg Hx    Colon cancer Neg Hx    Esophageal cancer Neg Hx    Social History   Tobacco Use   Smoking status: Never   Smokeless tobacco: Never  Vaping Use   Vaping status: Never Used  Substance Use Topics   Alcohol use: No   Drug use: No   Current Outpatient Medications  Medication Sig Dispense Refill   atovaquone -proguanil (MALARONE ) 250-100 MG TABS tablet Take 1  tablet by mouth daily. Begin 2 days pre-arrival, take daily during stay, and continue for 7 days post-travel for malaria prevention. 16 tablet 0   azithromycin  (ZITHROMAX ) 500 MG tablet For NON-Bloody diarrhea, take 2 tabs by mouth on day 1. If resolved, stop medication. If diarrhea persists, take 1 tablet on day 2 and 3. For BLOODY diarrhea, take 2 tablets on day 1 and 1 tablet on day 2 and 3. 4 tablet 0    ciprofloxacin  (CIPRO ) 500 MG tablet Take 1 tablet (500 mg total) by mouth 2 (two) times daily for 10 days for traveler's diarrhea. 20 tablet 0   docusate sodium (COLACE) 100 MG capsule Take 100 mg by mouth as needed for constipation. (Patient not taking: Reported on 02/27/2023)     EPINEPHrine  0.3 mg/0.3 mL IJ SOAJ injection use as directed for anaphylaxis (Patient not taking: Reported on 02/27/2023) 2 each 0   omeprazole  (PRILOSEC) 40 MG capsule Take 1 capsule by mouth twice daily 30 minutes prior to meals. 30 capsule 4   ondansetron  (ZOFRAN ) 8 MG tablet Take 1 tablet (8 mg total) by mouth every 8 (eight) hours as needed. 30 tablet 0   oseltamivir  (TAMIFLU ) 75 MG capsule Take 1 capsule (75 mg total) by mouth as directed. 10 capsule 0   polyethylene glycol powder (GLYCOLAX/MIRALAX) powder Take 17 g by mouth as needed. (Patient not taking: Reported on 02/27/2023)     scopolamine  (TRANSDERM-SCOP) 1 MG/3DAYS Place 1 patch (1.5 mg total) onto the skin behind the ears every 3 (three) days 5 patch 0   Semaglutide -Weight Management (WEGOVY ) 0.25 MG/0.5ML SOAJ Inject 0.5 mLs (0.25 mg total) subcutaneously once a week (Patient not taking: Reported on 02/27/2023) 2 mL 3   No current facility-administered medications for this visit.   No Known Allergies   Review of Systems: All systems reviewed and negative except where noted in HPI.    No results found.  Physical Exam: BP 120/72   Pulse 88   Ht 5' 10 (1.778 m)   Wt 186 lb (84.4 kg)   BMI 26.69 kg/m  Constitutional: Pleasant,well-developed, Caucasian male in no acute distress. HEENT: Normocephalic and atraumatic. Conjunctivae are normal. No scleral icterus. Neurological: Alert and oriented to person place and time. Skin: Skin is warm and dry. No rashes noted. Psychiatric: Normal mood and affect. Behavior is normal.  CBC No results found for: WBC, RBC, HGB, HCT, PLT, MCV, MCH, MCHC, RDW, LYMPHSABS, MONOABS, EOSABS,  BASOSABS  CMP  No results found for: NA, K, CL, CO2, GLUCOSE, BUN, CREATININE, CALCIUM, PROT, ALBUMIN, AST, ALT, ALKPHOS, BILITOT, GFRNONAA, GFRAA      No data to display            ASSESSMENT AND PLAN:  38 year old male with intermittent bright red blood per rectum and symptomatic grade 3 hemorrhoids.  Although his hematochezia is almost certainly from hemorrhoids, given his age a colonoscopy is not unreasonable to definitively exclude polyp/mass lesion as well as proctitis.  The patient desires to proceed with hemorrhoid banding given his persistent symptoms despite optimization of his bowel habits.  We will schedule him for banding following his colonoscopy.  Bright red blood per rectum Most likely secondary to internal hemorrhoids, but colonoscopy certainly reasonable to definitively exclude other causes such as polyp/mass lesion or proctitis - Colonoscopy  Symptomatic internal and external hemorrhoids Chronic hemorrhoids with occasional bleeding and prolapse requiring manual reduction. Previous thrombosis treated conservatively.  His symptoms of prolapse and perianal fullness/discomfort as well as hematochezia will  likely improve with hemorrhoid banding.  This will not help with his external hemorrhoid symptoms or reduce risk of recurrent thrombosis.  We discussed the importance of maintaining a good bowel regimen and avoiding prolonged toilet time to limit hemorrhoid symptoms.  Colonoscopy to rule out other bleeding causes. - Scheduled hemorrhoid banding post-colonoscopy. - Continue daily Miralax. - Limit toilet time to 5 minutes.  Chronic constipation Managed with daily Miralax, resulting in regular bowel movements. No significant straining or hard stools.  Continues to have symptomatic hemorrhoids despite optimization of bowel habits. - Continue daily Miralax.  Gastroesophageal reflux disease (GERD) Chronic GERD with reflux and occasional  dysphagia. Symptoms improved with omeprazole  and as needed Pepcid. Barium swallow showed mild esophageal dysmotility. No current need for endoscopy given improvement in symptoms with PPI and reassuring barium swallow findings. - Continue daily omeprazole  and as-needed Pepcid. - Advised dietary modifications. - Monitor symptoms and consider omeprazole  weaning if controlled. - Recommend EGD if patient's symptoms not responding to PPI, or dysphagia worsens  Esophageal dysmotility/dysphagia Mild dysmotility with occasional dysphagia and reflux. No strictures or masses. Dysmotility likely contributing to symptoms and may be related to underlying reflux. - Monitor symptoms and continue to manage GERD. - EGD if dysphagia worsens   Recording duration: 26 minutes      The details, risks (including bleeding, perforation, infection, missed lesions, medication reactions and possible hospitalization or surgery if complications occur), benefits, and alternatives to colonoscopy with possible biopsy and possible polypectomy were discussed with the patient and he consents to proceed.   Marylen Zuk E. Stacia, MD [facility]   Alla Amis, MD

## 2024-08-16 ENCOUNTER — Other Ambulatory Visit: Payer: Self-pay

## 2024-08-18 ENCOUNTER — Encounter: Payer: Self-pay | Admitting: Gastroenterology

## 2024-08-18 ENCOUNTER — Ambulatory Visit: Admitting: Gastroenterology

## 2024-08-18 VITALS — BP 93/63 | HR 72 | Temp 98.6°F | Resp 13 | Ht 70.0 in | Wt 186.0 lb

## 2024-08-18 DIAGNOSIS — K602 Anal fissure, unspecified: Secondary | ICD-10-CM

## 2024-08-18 DIAGNOSIS — D123 Benign neoplasm of transverse colon: Secondary | ICD-10-CM | POA: Diagnosis not present

## 2024-08-18 DIAGNOSIS — D120 Benign neoplasm of cecum: Secondary | ICD-10-CM

## 2024-08-18 DIAGNOSIS — R194 Change in bowel habit: Secondary | ICD-10-CM

## 2024-08-18 DIAGNOSIS — D122 Benign neoplasm of ascending colon: Secondary | ICD-10-CM | POA: Diagnosis not present

## 2024-08-18 DIAGNOSIS — K921 Melena: Secondary | ICD-10-CM

## 2024-08-18 MED ORDER — SODIUM CHLORIDE 0.9 % IV SOLN: 500.0000 mL | Freq: Once | INTRAVENOUS | Status: DC

## 2024-08-18 NOTE — Op Note (Signed)
 [facility] Patient Name: Andrew Harvey Procedure Date: 08/18/2024 2:01 PM MRN: [id number] Endoscopist: Glendia E. Stacia , MD, [id number] Age: 38 Referring MD:  Date of Birth: 04/05/1986 Gender: Male Account #: [id number] Procedure:                Colonoscopy Indications:              Rectal bleeding Medicines:                Monitored Anesthesia Care Procedure:                Pre-Anesthesia Assessment:                           - Prior to the procedure, a History and Physical                            was performed, and patient medications and                            allergies were reviewed. The patient's tolerance of                            previous anesthesia was also reviewed. The risks                            and benefits of the procedure and the sedation                            options and risks were discussed with the patient.                            All questions were answered, and informed consent                            was obtained. Prior Anticoagulants: The patient has                            taken no anticoagulant or antiplatelet agents. ASA                            Grade Assessment: II - A patient with mild systemic                            disease. After reviewing the risks and benefits,                            the patient was deemed in satisfactory condition to                            undergo the procedure.                           After obtaining informed consent, the colonoscope  was passed under direct vision. Throughout the                            procedure, the patient's blood pressure, pulse, and                            oxygen saturations were monitored continuously. The                            CF [id number] #[id number] was introduced through the anus                            and advanced to the the terminal ileum, with                            identification of the appendiceal  orifice and IC                            valve. The colonoscopy was performed without                            difficulty. The patient tolerated the procedure                            well. The quality of the bowel preparation was                            good. The terminal ileum, ileocecal valve,                            appendiceal orifice, and rectum were photographed.                            The bowel preparation used was SUPREP via split                            dose instruction. Scope In: 2:12:36 PM Scope Out: 2:34:13 PM Scope Withdrawal Time: 0 hours 16 minutes 55 seconds  Total Procedure Duration: 0 hours 21 minutes 37 seconds  Findings:                 An anal fissure was found on perianal exam.                           The digital rectal exam was normal. Pertinent                            negatives include normal sphincter tone and no                            palpable rectal lesions.                           A 5 mm polyp was found in the cecum. The polyp was  mucous-capped and sessile. The polyp was removed                            with a cold snare. Resection and retrieval were                            complete. Estimated blood loss was minimal.                           A 2 mm polyp was found in the proximal ascending                            colon. The polyp was sessile. The polyp was removed                            with a cold snare. Resection and retrieval were                            complete. Estimated blood loss was minimal.                           Two sessile polyps were found in the transverse                            colon. The polyps were 5 to 7 mm in size. These                            polyps were removed with a cold snare. Resection                            and retrieval were complete. Estimated blood loss                            was minimal.                           The exam was otherwise  normal throughout the                            examined colon.                           The terminal ileum appeared normal.                           The retroflexed view of the distal rectum and anal                            verge was normal and showed no anal or rectal                            abnormalities. Complications:            No immediate complications. Estimated Blood Loss:  Estimated blood loss was minimal. Impression:               - Healing anal fissure found on perianal exam.                           - One 5 mm polyp in the cecum, removed with a cold                            snare. Resected and retrieved.                           - One 2 mm polyp in the proximal ascending colon,                            removed with a cold snare. Resected and retrieved.                           - Two 5 to 7 mm polyps in the transverse colon,                            removed with a cold snare. Resected and retrieved.                           - The examined portion of the ileum was normal.                           - The distal rectum and anal verge are normal on                            retroflexion view. Recommendation:           - Patient has a contact number available for                            emergencies. The signs and symptoms of potential                            delayed complications were discussed with the                            patient. Return to normal activities tomorrow.                            Written discharge instructions were provided to the                            patient.                           - Resume previous diet.                           - Continue present medications.                           -  Await pathology results.                           - Repeat colonoscopy (date not yet determined) for                            surveillance based on pathology results.                           - Patient's blood in stool can be  attributed to                            either internal hemorhoids or anal fissure. No                            recent blood in stool per patient.                           - Continue daily Miralax Marthena Whitmyer E. Stacia, MD 08/18/2024 2:43:10 PM This report has been signed electronically.

## 2024-08-18 NOTE — Patient Instructions (Addendum)
 Resume previous diet  Continue present medications- continue daily Miralax  Await pathology results  See handout on polyps  YOU HAD AN ENDOSCOPIC PROCEDURE TODAY AT THE [facility]:   Refer to the procedure report that was given to you for any specific questions about what was found during the examination.  If the procedure report does not answer your questions, please call your gastroenterologist to clarify.  If you requested that your care partner not be given the details of your procedure findings, then the procedure report has been included in a sealed envelope for you to review at your convenience later.  YOU SHOULD EXPECT: Some feelings of bloating in the abdomen. Passage of more gas than usual.  Walking can help get rid of the air that was put into your GI tract during the procedure and reduce the bloating. If you had a lower endoscopy (such as a colonoscopy or flexible sigmoidoscopy) you may notice spotting of blood in your stool or on the toilet paper. If you underwent a bowel prep for your procedure, you may not have a normal bowel movement for a few days.  Please Note:  You might notice some irritation and congestion in your nose or some drainage.  This is from the oxygen used during your procedure.  There is no need for concern and it should clear up in a day or so.  SYMPTOMS TO REPORT IMMEDIATELY: Following lower endoscopy (colonoscopy or flexible sigmoidoscopy):  Excessive amounts of blood in the stool  Significant tenderness or worsening of abdominal pains  Swelling of the abdomen that is new, acute  Fever of 100F or higher For urgent or emergent issues, a gastroenterologist can be reached at any hour by calling [phone]. Do not use MyChart messaging for urgent concerns.   DIET:  We do recommend a small meal at first, but then you may proceed to your regular diet.  Drink plenty of fluids but you should avoid alcoholic beverages for 24 hours.  ACTIVITY:  You  should plan to take it easy for the rest of today and you should NOT DRIVE or use heavy machinery until tomorrow (because of the sedation medicines used during the test).    FOLLOW UP: Our staff will call the number listed on your records the next business day following your procedure.  We will call around 7:15- 8:00 am to check on you and address any questions or concerns that you may have regarding the information given to you following your procedure. If we do not reach you, we will leave a message.     If any biopsies were taken you will be contacted by phone or by letter within the next 1-3 weeks.  Please call us  at [phone] if you have not heard about the biopsies in 3 weeks.   SIGNATURES/CONFIDENTIALITY: You and/or your care partner have signed paperwork which will be entered into your electronic medical record.  These signatures attest to the fact that that the information above on your After Visit Summary has been reviewed and is understood.  Full responsibility of the confidentiality of this discharge information lies with you and/or your care-partner.

## 2024-08-18 NOTE — Progress Notes (Signed)
 Report to PACU, RN, vss, BBS= Clear.

## 2024-08-18 NOTE — Progress Notes (Signed)
 History and Physical Interval Note:  08/18/2024 2:03 PM  Andrew Harvey  has presented today for endoscopic procedure(s), with the diagnosis of  Encounter Diagnosis  Name Primary?   Change in bowel habits Yes  .  The various methods of evaluation and treatment have been discussed with the patient and/or family. After consideration of risks, benefits and other options for treatment, the patient has consented to  the endoscopic procedure(s).   The patient's history has been reviewed, patient examined, no change in status, stable for endoscopic procedure(s).  I have reviewed the patient's chart and labs.  Questions were answered to the patient's satisfaction.     [name], MD [facility]

## 2024-08-18 NOTE — Progress Notes (Signed)
 Pt's states no medical or surgical changes since previsit or office visit.

## 2024-08-18 NOTE — Progress Notes (Signed)
 Called to room to assist during endoscopic procedure.  Patient ID and intended procedure confirmed with present staff. Received instructions for my participation in the procedure from the performing physician.

## 2024-08-19 ENCOUNTER — Telehealth: Payer: Self-pay | Admitting: *Deleted

## 2024-08-19 NOTE — Telephone Encounter (Signed)
" °  Follow up Call-     08/18/2024    1:37 PM  Call back number  Post procedure Call Back phone  # [phone]  Permission to leave phone message Yes     Patient questions:  Do you have a fever, pain , or abdominal swelling? No. Pain Score  0 *  Have you tolerated food without any problems? Yes.    Have you been able to return to your normal activities? Yes.    Do you have any questions about your discharge instructions: Diet   No. Medications  No. Follow up visit  No.  Do you have questions or concerns about your Care? Yes.    Actions: * If pain score is 4 or above: No action needed, pain <4.  Dr. Stacia, This pt has an office appointment for hemorrhoidal banding in January.  Hemorrhoids were not noted on the report so he is asking if he still needs to keep the banding appointment?  Thanks, Josette   "

## 2024-08-22 NOTE — Telephone Encounter (Signed)
 Spoke with pt and gave him Dr. London recommendations.  He states he is feeling better at this time and wishes to cancel office banding appointment for now.  Encouraged to send a MyChart to Dr. Stacia if they become an issue in the future and an OV can be set up for a hemorrhoidal banding appointment.  Understanding voiced

## 2024-08-23 LAB — SURGICAL PATHOLOGY

## 2024-08-26 ENCOUNTER — Ambulatory Visit: Payer: Self-pay | Admitting: Gastroenterology

## 2024-08-26 NOTE — Progress Notes (Signed)
 Andrew Harvey,   All 4 polyps that I removed during your recent procedure were completely benign but were proven to be pre-cancerous polyps that MAY have grown into cancers if they had not been removed.  Studies shows that at least 20% of women over age 40 and 30% of men over age 71 have pre-cancerous polyps.  Based on current nationally recognized surveillance guidelines, I recommend that you have a repeat colonoscopy in 4 years.   If you develop any new rectal bleeding, abdominal pain or significant bowel habit changes, please contact me before then.

## 2024-09-21 ENCOUNTER — Encounter: Admitting: Gastroenterology
